# Patient Record
Sex: Female | Born: 1995 | Hispanic: No | Marital: Single | Smoking: Former smoker
Health system: Southern US, Community
[De-identification: ages and names within clinical notes are randomized; demographics above are authoritative.]

## PROBLEM LIST (undated history)

## (undated) DIAGNOSIS — J45909 Unspecified asthma, uncomplicated: Secondary | ICD-10-CM

## (undated) DIAGNOSIS — Z9109 Other allergy status, other than to drugs and biological substances: Secondary | ICD-10-CM

## (undated) DIAGNOSIS — K219 Gastro-esophageal reflux disease without esophagitis: Secondary | ICD-10-CM

## (undated) HISTORY — PX: NO PAST SURGERIES: SHX2092

## (undated) HISTORY — DX: Other allergy status, other than to drugs and biological substances: Z91.09

## (undated) HISTORY — DX: Unspecified asthma, uncomplicated: J45.909

---
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
## 2021-03-26 NOTE — L&D Delivery Note (Signed)
Delivery Note At 2:45 PM a viable female was delivered via Vaginal, Spontaneous (Presentation:   Occiput Anterior).  APGAR: 9, 9; weight  .   Placenta status: Spontaneous, Intact.  Cord: 3 vessels with the following complications: None.  Cord pH: n/a  Anesthesia: Epidural Episiotomy: None Lacerations: 2nd degree;Vaginal;Perineal Suture Repair:  0 vicryl Est. Blood Loss (mL):  153  Mom to postpartum.  Baby to Couplet care / Skin to Skin.  Purcell Nails 11/23/2021, 3:13 PM

## 2021-06-19 LAB — OB RESULTS CONSOLE RPR: RPR: NONREACTIVE

## 2021-06-19 LAB — OB RESULTS CONSOLE GC/CHLAMYDIA
Chlamydia: NEGATIVE
Neisseria Gonorrhea: NEGATIVE

## 2021-06-19 LAB — OB RESULTS CONSOLE HIV ANTIBODY (ROUTINE TESTING): HIV: NONREACTIVE

## 2021-06-19 LAB — HEPATITIS C ANTIBODY: HCV Ab: NEGATIVE

## 2021-06-19 LAB — OB RESULTS CONSOLE ABO/RH: RH Type: POSITIVE

## 2021-06-19 LAB — OB RESULTS CONSOLE HEPATITIS B SURFACE ANTIGEN: Hepatitis B Surface Ag: NEGATIVE

## 2021-06-19 LAB — OB RESULTS CONSOLE RUBELLA ANTIBODY, IGM: Rubella: NON-IMMUNE/NOT IMMUNE

## 2021-06-19 LAB — OB RESULTS CONSOLE ANTIBODY SCREEN: Antibody Screen: NEGATIVE

## 2021-11-02 LAB — OB RESULTS CONSOLE GBS: GBS: NEGATIVE

## 2021-11-22 ENCOUNTER — Inpatient Hospital Stay (HOSPITAL_COMMUNITY)
Admission: AD | Admit: 2021-11-22 | Discharge: 2021-11-25 | DRG: 806 | Disposition: A | Discharge status: Home / Self Care | Payer: BLUE CROSS/BLUE SHIELD | Attending: Obstetrics and Gynecology | Admitting: Obstetrics and Gynecology

## 2021-11-22 ENCOUNTER — Inpatient Hospital Stay (EMERGENCY_DEPARTMENT_HOSPITAL)
Admission: AD | Admit: 2021-11-22 | Discharge: 2021-11-22 | Disposition: A | Discharge status: Home / Self Care | Payer: BLUE CROSS/BLUE SHIELD | Source: Home / Self Care | Attending: Obstetrics & Gynecology | Admitting: Obstetrics & Gynecology

## 2021-11-22 ENCOUNTER — Other Ambulatory Visit: Payer: Self-pay

## 2021-11-22 ENCOUNTER — Encounter (HOSPITAL_COMMUNITY): Payer: Self-pay | Admitting: Obstetrics & Gynecology

## 2021-11-22 ENCOUNTER — Encounter (HOSPITAL_COMMUNITY): Payer: Self-pay | Admitting: *Deleted

## 2021-11-22 DIAGNOSIS — Z87891 Personal history of nicotine dependence: Secondary | ICD-10-CM

## 2021-11-22 DIAGNOSIS — J45909 Unspecified asthma, uncomplicated: Secondary | ICD-10-CM | POA: Diagnosis present

## 2021-11-22 DIAGNOSIS — O479 False labor, unspecified: Secondary | ICD-10-CM | POA: Diagnosis not present

## 2021-11-22 DIAGNOSIS — Z3A39 39 weeks gestation of pregnancy: Secondary | ICD-10-CM | POA: Insufficient documentation

## 2021-11-22 DIAGNOSIS — D62 Acute posthemorrhagic anemia: Secondary | ICD-10-CM | POA: Diagnosis not present

## 2021-11-22 DIAGNOSIS — O471 False labor at or after 37 completed weeks of gestation: Secondary | ICD-10-CM | POA: Insufficient documentation

## 2021-11-22 DIAGNOSIS — O9081 Anemia of the puerperium: Secondary | ICD-10-CM | POA: Diagnosis not present

## 2021-11-22 DIAGNOSIS — Z23 Encounter for immunization: Secondary | ICD-10-CM | POA: Diagnosis not present

## 2021-11-22 DIAGNOSIS — Z3689 Encounter for other specified antenatal screening: Secondary | ICD-10-CM

## 2021-11-22 DIAGNOSIS — O9952 Diseases of the respiratory system complicating childbirth: Secondary | ICD-10-CM | POA: Diagnosis present

## 2021-11-22 DIAGNOSIS — O4202 Full-term premature rupture of membranes, onset of labor within 24 hours of rupture: Secondary | ICD-10-CM

## 2021-11-22 DIAGNOSIS — O26893 Other specified pregnancy related conditions, third trimester: Secondary | ICD-10-CM | POA: Diagnosis present

## 2021-11-22 HISTORY — DX: Gastro-esophageal reflux disease without esophagitis: K21.9

## 2021-11-22 LAB — URINALYSIS, ROUTINE W REFLEX MICROSCOPIC
Bilirubin Urine: NEGATIVE
Glucose, UA: NEGATIVE mg/dL
Ketones, ur: NEGATIVE mg/dL
Nitrite: NEGATIVE
Protein, ur: NEGATIVE mg/dL
Specific Gravity, Urine: 1.004 — ABNORMAL LOW (ref 1.005–1.030)
pH: 7 (ref 5.0–8.0)

## 2021-11-22 LAB — CBC
HCT: 36.7 % (ref 36.0–46.0)
Hemoglobin: 11.9 g/dL — ABNORMAL LOW (ref 12.0–15.0)
MCH: 27.9 pg (ref 26.0–34.0)
MCHC: 32.4 g/dL (ref 30.0–36.0)
MCV: 85.9 fL (ref 80.0–100.0)
Platelets: 206 10*3/uL (ref 150–400)
RBC: 4.27 MIL/uL (ref 3.87–5.11)
RDW: 14.4 % (ref 11.5–15.5)
WBC: 12.9 10*3/uL — ABNORMAL HIGH (ref 4.0–10.5)
nRBC: 0 % (ref 0.0–0.2)

## 2021-11-22 LAB — TYPE AND SCREEN
ABO/RH(D): A POS
Antibody Screen: NEGATIVE

## 2021-11-22 MED ORDER — ONDANSETRON HCL 4 MG/2ML IJ SOLN: 4.0000 mg | Freq: Four times a day (QID) | INTRAMUSCULAR | Status: DC | PRN

## 2021-11-22 MED ORDER — OXYTOCIN-SODIUM CHLORIDE 30-0.9 UT/500ML-% IV SOLN
1.0000 m[IU]/min | INTRAVENOUS | Status: DC
  Administered 2021-11-22 – 2021-11-23 (×2): 2 m[IU]/min via INTRAVENOUS
  Filled 2021-11-22: qty 500

## 2021-11-22 MED ORDER — FLEET ENEMA 7-19 GM/118ML RE ENEM: 1.0000 | ENEMA | RECTAL | Status: DC | PRN

## 2021-11-22 MED ORDER — ACETAMINOPHEN 325 MG PO TABS: 650.0000 mg | ORAL_TABLET | ORAL | Status: DC | PRN

## 2021-11-22 MED ORDER — OXYTOCIN-SODIUM CHLORIDE 30-0.9 UT/500ML-% IV SOLN: 2.5000 [IU]/h | INTRAVENOUS | Status: DC

## 2021-11-22 MED ORDER — LIDOCAINE HCL (PF) 1 % IJ SOLN
30.0000 mL | INTRAMUSCULAR | Status: DC | PRN
Start: 2021-11-22 — End: 2021-11-23

## 2021-11-22 MED ORDER — OXYCODONE-ACETAMINOPHEN 5-325 MG PO TABS: 1.0000 | ORAL_TABLET | ORAL | Status: DC | PRN

## 2021-11-22 MED ORDER — OXYTOCIN BOLUS FROM INFUSION
333.0000 mL | Freq: Once | INTRAVENOUS | Status: AC
  Administered 2021-11-23: 333 mL via INTRAVENOUS

## 2021-11-22 MED ORDER — LACTATED RINGERS IV SOLN: INTRAVENOUS | Status: DC

## 2021-11-22 MED ORDER — OXYCODONE-ACETAMINOPHEN 5-325 MG PO TABS: 2.0000 | ORAL_TABLET | ORAL | Status: DC | PRN

## 2021-11-22 MED ORDER — TERBUTALINE SULFATE 1 MG/ML IJ SOLN
0.2500 mg | Freq: Once | INTRAMUSCULAR | Status: AC | PRN
  Administered 2021-11-23: 0.25 mg via SUBCUTANEOUS
  Filled 2021-11-22: qty 1

## 2021-11-22 MED ORDER — LACTATED RINGERS IV SOLN
500.0000 mL | INTRAVENOUS | Status: DC | PRN
  Administered 2021-11-23 (×3): 500 mL via INTRAVENOUS

## 2021-11-22 MED ORDER — SOD CITRATE-CITRIC ACID 500-334 MG/5ML PO SOLN: 30.0000 mL | ORAL | Status: DC | PRN

## 2021-11-22 MED ORDER — FENTANYL CITRATE (PF) 100 MCG/2ML IJ SOLN
100.0000 ug | INTRAMUSCULAR | Status: DC | PRN
  Administered 2021-11-22 – 2021-11-23 (×2): 100 ug via INTRAVENOUS
  Filled 2021-11-22 (×2): qty 2

## 2021-11-22 NOTE — MAU Provider Note (Signed)
History   [id number]   Chief Complaint  Patient presents with   Labor Eval    HPI [place] C Mccaughan is a 26 y.o. female  G1P0 @39 .3 wks here with report of leaking pink tinged clear fluid since 1400 today.  Leaking of fluid has continued. Pt reports regular painful contractions. She denies vaginal bleeding. She reports good fetal movement. All other systems negative.    No LMP recorded. Patient is pregnant.  OB History  Gravida Para Term Preterm AB Living  1            SAB IAB Ectopic Multiple Live Births               # Outcome Date GA Lbr Len/2nd Weight Sex Delivery Anes PTL Lv  1 Current             Past Medical History:  Diagnosis Date   Asthma, chronic 09/08/2013   Environmental allergies 09/08/2013   GERD (gastroesophageal reflux disease)     No family history on file.  Social History   Socioeconomic History   Marital status: Single    Spouse name: Not on file   Number of children: Not on file   Years of education: Not on file   Highest education level: Not on file  Occupational History   Not on file  Tobacco Use   Smoking status: Former    Types: Cigarettes   Smokeless tobacco: Never  Substance and Sexual Activity   Alcohol use: Not Currently   Drug use: Never   Sexual activity: Not on file  Other Topics Concern   Not on file  Social History Narrative   Not on file   Social Determinants of Health   Financial Resource Strain: Not on file  Food Insecurity: Not on file  Transportation Needs: Not on file  Physical Activity: Not on file  Stress: Not on file  Social Connections: Not on file    No Known Allergies  No current facility-administered medications on file prior to encounter.   Current Outpatient Medications on File Prior to Encounter  Medication Sig Dispense Refill   Pantoprazole Sodium (PROTONIX PO) Take 20 mg by mouth daily.     Prenatal Vit-Fe Fumarate-FA (MULTIVITAMIN-PRENATAL) 27-0.8 MG TABS tablet Take 1 tablet by mouth daily  at 12 noon.       Review of Systems  Gastrointestinal:  Positive for abdominal pain.  Genitourinary:  Positive for vaginal discharge.     Physical Exam   Vitals:   11/22/21 1814  BP: 133/73  Pulse: 87  Resp: 18    Physical Exam Vitals and nursing note reviewed.  Constitutional:      General: She is not in acute distress.    Appearance: Normal appearance.  HENT:     Head: Normocephalic and atraumatic.  Cardiovascular:     Rate and Rhythm: Normal rate.  Pulmonary:     Effort: Pulmonary effort is normal. No respiratory distress.  Genitourinary:    Comments: SSE: +pool, fern pos SVE: 3/90/-1, vtx Musculoskeletal:        General: Normal range of motion.     Cervical back: Normal range of motion.  Skin:    General: Skin is warm and dry.  Neurological:     General: No focal deficit present.     Mental Status: She is alert and oriented to person, place, and time.  Psychiatric:        Mood and Affect: Mood normal.  Behavior: Behavior normal.   EFM: 145 bpm, mod variability, + accels, variable decels Toco: 3-4  Results for orders placed or performed during the hospital encounter of 11/22/21 (from the past 24 hour(s))  Urinalysis, Routine w reflex microscopic Urine, Clean Catch     Status: Abnormal   Collection Time: 11/22/21 12:48 PM  Result Value Ref Range   Color, Urine YELLOW YELLOW   APPearance HAZY (A) CLEAR   Specific Gravity, Urine 1.004 (L) 1.005 - 1.030   pH 7.0 5.0 - 8.0   Glucose, UA NEGATIVE NEGATIVE mg/dL   Hgb urine dipstick SMALL (A) NEGATIVE   Bilirubin Urine NEGATIVE NEGATIVE   Ketones, ur NEGATIVE NEGATIVE mg/dL   Protein, ur NEGATIVE NEGATIVE mg/dL   Nitrite NEGATIVE NEGATIVE   Leukocytes,Ua TRACE (A) NEGATIVE   RBC / HPF 0-5 0 - 5 RBC/hpf   WBC, UA 0-5 0 - 5 WBC/hpf   Bacteria, UA RARE (A) NONE SEEN   Squamous Epithelial / LPF 0-5 0 - 5    MAU Course  Procedures  MDM SROM confirmed. Plan for admit.   Assessment and Plan   1.  [redacted] weeks gestation of pregnancy   2. Indication for care in labor or delivery    Admit to LD Mngt per primary OB   Donette Larry, [place] 11/22/2021 7:10 PM

## 2021-11-22 NOTE — MAU Note (Signed)
Brianna Wallace is a 26 y.o. at [redacted]w[redacted]d here in MAU reporting: ctxs that began this morning @ approx 0700, states ctxs are 3-4 minutes apart.  Denies VB or LOF, reports some bloody show.  Endorses +FM. LMP: N/A Onset of complaint: today Pain score: 7 Vitals:   11/22/21 1245  BP: 135/84  Pulse: 85  Resp: 20  Temp: 98.5 F (36.9 C)  SpO2: 97%     FHT:153 bpm Lab orders placed from triage:   UA
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
## 2021-11-22 NOTE — MAU Note (Signed)
Pt here earlier for labor check. Reports ctx stronger and closer. Has leaked some pink fluid. Good fetal movement reported.

## 2021-11-22 NOTE — MAU Provider Note (Signed)
Per RN: Ms. [place] C Saran is a 26 y.o. G1P0 at [redacted]w[redacted]d  who presents to MAU today complaining contractions q 3-4 minutes since 0700. No VB or LOF. +FM.   O: BP 135/84 (BP Location: Right Arm)   Pulse 85   Temp 98.5 F (36.9 C) (Oral)   Resp 20   Ht 5\' 3"  (1.6 m)   Wt 87.6 kg   SpO2 97%   BMI 34.22 kg/m   Cervical exam:  Dilation: Fingertip Effacement (%): 50 Station: -2 Exam by:: K.Wilson,RN  Fetal Monitoring: Baseline: 145 Variability: mod Accelerations: + Decelerations: no Contractions: irreg   A: SIUP at [redacted]w[redacted]d  False labor Reactive NST  P: Discharge home Follow up @[facility]  as scheduled Labor precautions  [redacted]w[redacted]d, CNM 11/22/2021 1:46 PM

## 2021-11-23 ENCOUNTER — Encounter (HOSPITAL_COMMUNITY): Payer: Self-pay | Admitting: Obstetrics & Gynecology

## 2021-11-23 ENCOUNTER — Inpatient Hospital Stay (HOSPITAL_COMMUNITY): Payer: BLUE CROSS/BLUE SHIELD | Admitting: Anesthesiology

## 2021-11-23 LAB — RPR: RPR Ser Ql: NONREACTIVE

## 2021-11-23 MED ORDER — OXYTOCIN-SODIUM CHLORIDE 30-0.9 UT/500ML-% IV SOLN: 1.0000 m[IU]/min | INTRAVENOUS | Status: DC

## 2021-11-23 MED ORDER — OXYCODONE HCL 5 MG PO TABS: 10.0000 mg | ORAL_TABLET | ORAL | Status: DC | PRN

## 2021-11-23 MED ORDER — LIDOCAINE HCL (PF) 1 % IJ SOLN
INTRAMUSCULAR | Status: DC | PRN
  Administered 2021-11-23: 8 mL via EPIDURAL

## 2021-11-23 MED ORDER — TETANUS-DIPHTH-ACELL PERTUSSIS 5-2.5-18.5 LF-MCG/0.5 IM SUSY: 0.5000 mL | PREFILLED_SYRINGE | Freq: Once | INTRAMUSCULAR | Status: DC

## 2021-11-23 MED ORDER — EPHEDRINE 5 MG/ML INJ
10.0000 mg | INTRAVENOUS | Status: DC | PRN
Start: 2021-11-23 — End: 2021-11-23

## 2021-11-23 MED ORDER — TRANEXAMIC ACID-NACL 1000-0.7 MG/100ML-% IV SOLN
INTRAVENOUS | Status: AC
  Filled 2021-11-23: qty 100

## 2021-11-23 MED ORDER — DIPHENHYDRAMINE HCL 50 MG/ML IJ SOLN: 12.5000 mg | INTRAMUSCULAR | Status: DC | PRN

## 2021-11-23 MED ORDER — COCONUT OIL OIL: 1.0000 | TOPICAL_OIL | Status: DC | PRN

## 2021-11-23 MED ORDER — PRENATAL MULTIVITAMIN CH
1.0000 | ORAL_TABLET | Freq: Every day | ORAL | Status: DC
  Administered 2021-11-24 – 2021-11-25 (×2): 1 via ORAL
  Filled 2021-11-23 (×2): qty 1

## 2021-11-23 MED ORDER — PANTOPRAZOLE SODIUM 20 MG PO TBEC: 20.0000 mg | DELAYED_RELEASE_TABLET | Freq: Every day | ORAL | Status: DC | PRN

## 2021-11-23 MED ORDER — ZOLPIDEM TARTRATE 5 MG PO TABS: 5.0000 mg | ORAL_TABLET | Freq: Every evening | ORAL | Status: DC | PRN

## 2021-11-23 MED ORDER — DIPHENHYDRAMINE HCL 25 MG PO CAPS: 25.0000 mg | ORAL_CAPSULE | Freq: Four times a day (QID) | ORAL | Status: DC | PRN

## 2021-11-23 MED ORDER — FENTANYL-BUPIVACAINE-NACL 0.5-0.125-0.9 MG/250ML-% EP SOLN
12.0000 mL/h | EPIDURAL | Status: DC | PRN
  Administered 2021-11-23: 12 mL/h via EPIDURAL
  Filled 2021-11-23: qty 250

## 2021-11-23 MED ORDER — BENZOCAINE-MENTHOL 20-0.5 % EX AERO
1.0000 | INHALATION_SPRAY | CUTANEOUS | Status: DC | PRN
  Filled 2021-11-23: qty 56

## 2021-11-23 MED ORDER — OXYCODONE HCL 5 MG PO TABS: 5.0000 mg | ORAL_TABLET | ORAL | Status: DC | PRN

## 2021-11-23 MED ORDER — SIMETHICONE 80 MG PO CHEW: 80.0000 mg | CHEWABLE_TABLET | ORAL | Status: DC | PRN

## 2021-11-23 MED ORDER — TRANEXAMIC ACID-NACL 1000-0.7 MG/100ML-% IV SOLN
1000.0000 mg | INTRAVENOUS | Status: AC
Start: 2021-11-23 — End: 2021-11-23
  Administered 2021-11-23: 1000 mg via INTRAVENOUS

## 2021-11-23 MED ORDER — PHENYLEPHRINE 80 MCG/ML (10ML) SYRINGE FOR IV PUSH (FOR BLOOD PRESSURE SUPPORT)
80.0000 ug | PREFILLED_SYRINGE | INTRAVENOUS | Status: DC | PRN
  Filled 2021-11-23: qty 10

## 2021-11-23 MED ORDER — PHENYLEPHRINE 80 MCG/ML (10ML) SYRINGE FOR IV PUSH (FOR BLOOD PRESSURE SUPPORT): 80.0000 ug | PREFILLED_SYRINGE | INTRAVENOUS | Status: DC | PRN

## 2021-11-23 MED ORDER — ONDANSETRON HCL 4 MG/2ML IJ SOLN: 4.0000 mg | INTRAMUSCULAR | Status: DC | PRN

## 2021-11-23 MED ORDER — IBUPROFEN 600 MG PO TABS
600.0000 mg | ORAL_TABLET | Freq: Four times a day (QID) | ORAL | Status: DC
  Administered 2021-11-23 – 2021-11-25 (×9): 600 mg via ORAL
  Filled 2021-11-23 (×9): qty 1

## 2021-11-23 MED ORDER — ONDANSETRON HCL 4 MG PO TABS: 4.0000 mg | ORAL_TABLET | ORAL | Status: DC | PRN

## 2021-11-23 MED ORDER — ACETAMINOPHEN 325 MG PO TABS: 650.0000 mg | ORAL_TABLET | ORAL | Status: DC | PRN

## 2021-11-23 MED ORDER — LACTATED RINGERS IV SOLN
500.0000 mL | Freq: Once | INTRAVENOUS | Status: AC
  Administered 2021-11-23: 500 mL via INTRAVENOUS

## 2021-11-23 MED ORDER — DIBUCAINE (PERIANAL) 1 % EX OINT: 1.0000 | TOPICAL_OINTMENT | CUTANEOUS | Status: DC | PRN

## 2021-11-23 MED ORDER — WITCH HAZEL-GLYCERIN EX PADS: 1.0000 | MEDICATED_PAD | CUTANEOUS | Status: DC | PRN

## 2021-11-23 MED ORDER — EPHEDRINE 5 MG/ML INJ: 10.0000 mg | INTRAVENOUS | Status: DC | PRN

## 2021-11-23 MED ORDER — OXYTOCIN-SODIUM CHLORIDE 30-0.9 UT/500ML-% IV SOLN: 2.5000 [IU]/h | INTRAVENOUS | Status: DC | PRN

## 2021-11-23 MED ORDER — SENNOSIDES-DOCUSATE SODIUM 8.6-50 MG PO TABS
2.0000 | ORAL_TABLET | Freq: Every day | ORAL | Status: DC
  Administered 2021-11-24: 2 via ORAL
  Filled 2021-11-23: qty 2

## 2021-11-23 NOTE — Lactation Note (Signed)
This note was copied from a baby's chart. Lactation Consultation Note  Patient Name: Brianna Wallace [id number] Date: 11/23/2021 Reason for consult: L&D Initial assessment;Primapara;Term;1st time breastfeeding Age:25 hours   Initial L&D Consult:  Arrived to assist with first latch, however, RN in room assessing and weighing baby.  Informed me that she was able to assist with latching and baby fed well.  Thanked Charity fundraiser for her assistance.  Will follow up on the M/B unit.  Allowed time for family bonding.   Maternal Data    Feeding Mother's Current Feeding Choice: Breast Milk  LATCH Score Latch: Grasps breast easily, tongue down, lips flanged, rhythmical sucking.  Audible Swallowing: A few with stimulation  Type of Nipple: Everted at rest and after stimulation  Comfort (Breast/Nipple): Soft / non-tender  Hold (Positioning): Full assist, staff holds infant at breast  LATCH Score: 7   Lactation Tools Discussed/Used    Interventions    Discharge    Consult Status Consult Status: Follow-up from L&D    Quinesha Selinger R Sela Falk 11/23/2021, 4:03 PM

## 2021-11-23 NOTE — Anesthesia Preprocedure Evaluation (Signed)
Anesthesia Evaluation  Patient identified by MRN, date of birth, ID band Patient awake    Reviewed: Allergy & Precautions, H&P , Patient's Chart, lab work & pertinent test results  Airway Mallampati: I       Dental no notable dental hx.    Pulmonary former smoker,    Pulmonary exam normal        Cardiovascular negative cardio ROS Normal cardiovascular exam     Neuro/Psych Anxiety negative neurological ROS     GI/Hepatic Neg liver ROS,   Endo/Other  negative endocrine ROS  Renal/GU negative Renal ROS     Musculoskeletal negative musculoskeletal ROS (+)   Abdominal (+) + obese,   Peds  Hematology negative hematology ROS (+)   Anesthesia Other Findings   Reproductive/Obstetrics (+) Pregnancy                             Anesthesia Physical Anesthesia Plan  ASA: 2  Anesthesia Plan: Epidural   Post-op Pain Management:    Induction:   PONV Risk Score and Plan:   Airway Management Planned:   Additional Equipment:   Intra-op Plan:   Post-operative Plan:   Informed Consent: I have reviewed the patients History and Physical, chart, labs and discussed the procedure including the risks, benefits and alternatives for the proposed anesthesia with the patient or authorized representative who has indicated his/her understanding and acceptance.       Plan Discussed with:   Anesthesia Plan Comments:         Anesthesia Quick Evaluation

## 2021-11-23 NOTE — Lactation Note (Signed)
This note was copied from a baby's chart. Lactation Consultation Note  Patient Name: Brianna Wallace [id number] Date: 11/23/2021 Reason for consult: Initial assessment;Mother's request;Primapara;1st time breastfeeding;Term;Breastfeeding assistance Age:25 hours  Birth parent stated feeding for over an hour. Compression stripe noted. LC assisted to latch deeper with signs of milk transfer.   Plan 1. To feed based on cues 8-12x 24hr period. To latch at the breast note signs of milk transfer.  2. If unable to latch or still cueing offer colostrum on spoon 5-7 ml per feeding.  All questions answered at the end of the visit.  Nipples compressed but skin intact. Birth parent denied any pain with the latch.  All questions answered at the end of the visit.   Maternal Data Has patient been taught Hand Expression?: Yes Does the patient have breastfeeding experience prior to this delivery?: No  Feeding Mother's Current Feeding Choice: Breast Milk  LATCH Score Latch: Repeated attempts needed to sustain latch, nipple held in mouth throughout feeding, stimulation needed to elicit sucking reflex.  Audible Swallowing: A few with stimulation  Type of Nipple: Everted at rest and after stimulation  Comfort (Breast/Nipple): Soft / non-tender  Hold (Positioning): Assistance needed to correctly position infant at breast and maintain latch.  LATCH Score: 7   Lactation Tools Discussed/Used    Interventions Interventions: Breast feeding basics reviewed;Assisted with latch;Skin to skin;Breast massage;Hand express;Breast compression;Adjust position;Support pillows;Position options;Expressed milk;Education;LC Psychologist, educational;Visual merchandiser education  Discharge WIC Program: Yes  Consult Status Consult Status: Follow-up Date: 11/24/21 Follow-up type: In-patient    Angi Goodell  [place] 11/23/2021, 10:12 PM

## 2021-11-23 NOTE — Anesthesia Procedure Notes (Signed)
Epidural Patient location during procedure: OB Start time: 11/23/2021 1:35 AM End time: 11/23/2021 1:40 AM  Staffing Anesthesiologist: Leilani Able, MD Performed: anesthesiologist   Preanesthetic Checklist Completed: patient identified, IV checked, site marked, risks and benefits discussed, surgical consent, monitors and equipment checked, pre-op evaluation and timeout performed  Epidural Patient position: sitting Prep: DuraPrep and site prepped and draped Patient monitoring: continuous pulse ox and blood pressure Approach: midline Location: L3-L4 Injection technique: LOR air  Needle:  Needle type: Tuohy  Needle gauge: 17 G Needle length: 9 cm and 9 Needle insertion depth: 7 cm Catheter type: closed end flexible Catheter size: 19 Gauge Catheter at skin depth: 12 cm Test dose: negative and Other  Assessment Events: blood not aspirated, injection not painful, no injection resistance, no paresthesia and negative IV test  Additional Notes Reason for block:procedure for pain

## 2021-11-24 LAB — CBC
HCT: 30.9 % — ABNORMAL LOW (ref 36.0–46.0)
Hemoglobin: 10 g/dL — ABNORMAL LOW (ref 12.0–15.0)
MCH: 27.7 pg (ref 26.0–34.0)
MCHC: 32.4 g/dL (ref 30.0–36.0)
MCV: 85.6 fL (ref 80.0–100.0)
Platelets: 185 10*3/uL (ref 150–400)
RBC: 3.61 MIL/uL — ABNORMAL LOW (ref 3.87–5.11)
RDW: 14.7 % (ref 11.5–15.5)
WBC: 15.8 10*3/uL — ABNORMAL HIGH (ref 4.0–10.5)
nRBC: 0 % (ref 0.0–0.2)

## 2021-11-24 MED ORDER — MEASLES, MUMPS & RUBELLA VAC IJ SOLR
0.5000 mL | Freq: Once | INTRAMUSCULAR | Status: AC
  Administered 2021-11-25: 0.5 mL via SUBCUTANEOUS
  Filled 2021-11-24: qty 0.5

## 2021-11-24 NOTE — Social Work (Addendum)
MOB was referred for history of depression.  * Referral screened out by Clinical Social Worker because none of the following criteria appear to apply:  ~ History of anxiety/depression during this pregnancy, or of post-partum depression following prior delivery.  ~ Diagnosis of anxiety and/or depression within last 3 years. Per chart review, MOB had depression as a teenager, no recent issues. No other mental health concerns noted in the OB record. MOB completed Edinburgh score 6.   OR  * MOB's symptoms currently being treated with medication and/or therapy.  Please contact the Clinical Social Worker if needs arise, by [facility] request, or if MOB scores greater than 9/yes to question 10 on Edinburgh Postpartum Depression Screen.   Vivi Barrack, MSW, LCSW [facility] [place]  Clinical Social Worker  [phone] 11/24/2021  9:15 AM

## 2021-11-24 NOTE — Lactation Note (Signed)
This note was copied from a baby's chart. Lactation Consultation Note  Patient Name: Brianna Wallace [id number] Date: 11/24/2021 Reason for consult: Follow-up assessment;1st time breastfeeding;Term Age:25 hours LC changed void and greenish stool while in room, Infant had 4 voids and 2 stools since birth. Birth Parent was concern that she doesn't have enough milk, recently infant is wanting BF more frequently, LC explained infant is past 24 hrs and  infant is starting to cluster feed this is normal pattern of infant behavior. LC did not observe latch infant recently BF for 19 minutes, Birth Parent nipples are intact no abrasions or sores.  LC advised Birth Parent to continue to BF on demand, by cues, 8 to 12+ times within 24 hours, STS. LC reviewed hand expression, Birth Parent expressed 3 mls that was spoon fed to infant, infant appeared content afterwards and infant fell asleep after diaper change and swaddle. Birth Parent is thinking about supplementing infant with donor breast milk but choose not to at this time. Birth Parent requested to use DEBP, LC set up pump and explained how to use, Birth Parent plans to pump 6 times within 24 hrs, pump 15 minutes on initial setting. Birth Parent knows to call RN/LC if their are BF questions, concerns or need latch assistance.  Maternal Data    Feeding Mother's Current Feeding Choice: Breast Milk  LATCH Score  Lactation Tools Discussed/Used Tools: Pump Breast pump type: Double-Electric Breast Pump Pump Education: Setup, frequency, and cleaning;Milk Storage Reason for Pumping: Birth Parent request Pumping frequency: Birth Parent will pump 6x within 24 hrs at her discretion.  Interventions Interventions: Skin to skin;Hand express;Expressed milk;Education;DEBP  Discharge    Consult Status Consult Status: Follow-up Date: 11/25/21 Follow-up type: In-patient    Brianna Wallace 11/24/2021, 5:10 PM

## 2021-11-24 NOTE — Anesthesia Postprocedure Evaluation (Signed)
Anesthesia Post Note  Patient: [place] C Lawrance  Procedure(s) Performed: AN AD HOC LABOR EPIDURAL     Patient location during evaluation: Mother Baby Anesthesia Type: Epidural Level of consciousness: awake and alert Pain management: pain level controlled Vital Signs Assessment: post-procedure vital signs reviewed and stable Respiratory status: spontaneous breathing, nonlabored ventilation and respiratory function stable Cardiovascular status: stable Postop Assessment: no headache, no backache, epidural receding, no apparent nausea or vomiting, patient able to bend at knees, adequate PO intake and able to ambulate Anesthetic complications: no   No notable events documented.  Last Vitals:  Vitals:   11/23/21 2220 11/24/21 0220  BP: 124/68 122/74  Pulse: 64 71  Resp: 18 18  Temp: 37 C 36.7 C  SpO2: 100% 100%    Last Pain:  Vitals:   11/24/21 0620  TempSrc:   PainSc: 0-No pain   Pain Goal:                   Laban Emperor

## 2021-11-24 NOTE — Progress Notes (Signed)
PPD# 1 SVD w/ 2nd degree;Vaginal;Perineal Information for the patient's newborn:  Brianna Wallace, Girl [place] [[id number]]  female    S:   Reports feeling tired and cramping Tolerating PO fluid and solids No nausea or vomiting Bleeding is light, no clots Pain controlled with acetaminophen and ibuprofen (OTC) Up ad lib / ambulatory / voiding w/o difficulty Feeding: Breast    O:   VS: BP 122/74 (BP Location: Left Arm)   Pulse 71   Temp 98 F (36.7 C) (Oral)   Resp 18   SpO2 100%   Breastfeeding Unknown   LABS:  Recent Labs    11/22/21 1912 11/24/21 0528  WBC 12.9* 15.8*  HGB 11.9* 10.0*  PLT 206 185   Blood type: --/--/A POS (08/30 1920) Rubella: Nonimmune (03/27 0000)                      I&O: Intake/Output      08/31 0701 09/01 0700 09/01 0701 09/02 0700   Urine 1100    Blood 153    Total Output 1253    Net -1253         Urine Occurrence 2 x      Physical Exam: Alert and oriented X3 Lungs: Clear and unlabored Heart: regular rate and rhythm / no mumurs Abdomen: soft, non-tender, non-distended  Fundus: firm, non-tender Perineum: approximated, healing Lochia: appropriate Extremities: no edema, no calf pain, tenderness, or cords    A:  PPD # 1  Normal exam  P:  Routine post partum orders Lactation assistance Anticipate D/C on 11/25/21  Plan reviewed w/ Dr. Camillo Flaming, DNP, CNM 11/24/2021, 8:30 AM

## 2021-11-25 DIAGNOSIS — D62 Acute posthemorrhagic anemia: Secondary | ICD-10-CM | POA: Diagnosis not present

## 2021-11-25 MED ORDER — ACETAMINOPHEN 325 MG PO TABS: 650.0000 mg | ORAL_TABLET | Freq: Four times a day (QID) | ORAL | Status: AC

## 2021-11-25 MED ORDER — IBUPROFEN 600 MG PO TABS: 600.0000 mg | ORAL_TABLET | Freq: Four times a day (QID) | ORAL | 0 refills | Status: AC

## 2021-11-25 NOTE — Discharge Summary (Cosign Needed Addendum)
Postpartum Discharge Summary  Date of Service updated 11/25/21     Patient Name: Brianna Wallace DOB: October 19, 1995 MRN: [id number]  Date of admission: 11/22/2021 Delivery date:11/23/2021  Delivering provider: Everett Graff  Date of discharge: 11/25/2021  Admitting diagnosis: Normal labor [O80, Z37.9] Intrauterine pregnancy: [redacted]w[redacted]d     Secondary diagnosis:  Principal Problem:   Normal labor Active Problems:   SVD (spontaneous vaginal delivery)   Acute blood loss anemia  Additional problems: none    Discharge diagnosis: Term Pregnancy Delivered and Anemia                                              Post partum procedures: none Augmentation: Pitocin Complications: None  Hospital course: Onset of Labor With Vaginal Delivery      26 y.o. yo G1P1001 at [redacted]w[redacted]d was admitted in Latent Labor with spontaneous rupture of membranes on 11/22/2021. Patient had an uncomplicated labor course as follows:  Membrane Rupture Time/Date: 2:00 PM ,11/22/2021   Delivery Method:Vaginal, Spontaneous  Episiotomy: None  Lacerations:  2nd degree;Vaginal;Perineal  Patient had an uncomplicated postpartum course.  She is ambulating, tolerating a regular diet, passing flatus, and urinating well. Denies need for medication for hx of anxiety. Reports adequate support from S/O and family. Agrees to mood eval at 2 weeks. Patient is discharged home in stable condition on 11/25/21.  Newborn Data: Birth date:11/23/2021  Birth time:2:45 PM  Gender:Female  Living status:Living  Apgars:9 ,9  Weight:2960 g   Magnesium Sulfate received: No BMZ received: No Rhophylac:N/A MMR: to be administered prior to discharge Transfusion:No  Physical exam  Vitals:   11/24/21 0220 11/24/21 1323 11/24/21 2015 11/25/21 0543  BP: 122/74 117/72 120/68 117/71  Pulse: 71 88 70 67  Resp: $Remo'18 18 18 18  'MuAOP$ Temp: 98 F (36.7 C) 98.4 F (36.9 C) 98 F (36.7 C) 98.4 F (36.9 C)  TempSrc: Oral Oral Oral Oral  SpO2: 100%   98%    General: alert, cooperative, and no distress Lochia: appropriate Uterine Fundus: firm Incision: N/A DVT Evaluation: No evidence of DVT seen on physical exam. No cords or calf tenderness. No significant calf/ankle edema. Labs: Lab Results  Component Value Date   WBC 15.8 (H) 11/24/2021   HGB 10.0 (L) 11/24/2021   HCT 30.9 (L) 11/24/2021   MCV 85.6 11/24/2021   PLT 185 11/24/2021       No data to display         Edinburgh Score:    11/23/2021    6:21 PM  Edinburgh Postnatal Depression Scale Screening Tool  I have been able to laugh and see the funny side of things. 0  I have looked forward with enjoyment to things. 0  I have blamed myself unnecessarily when things went wrong. 1  I have been anxious or worried for no good reason. 2  I have felt scared or panicky for no good reason. 1  Things have been getting on top of me. 0  I have been so unhappy that I have had difficulty sleeping. 0  I have felt sad or miserable. 1  I have been so unhappy that I have been crying. 1  The thought of harming myself has occurred to me. 0  Edinburgh Postnatal Depression Scale Total 6      After visit meds:  Allergies as of 11/25/2021  No Known Allergies      Medication List     STOP taking these medications    PROTONIX PO       TAKE these medications    acetaminophen 325 MG tablet Commonly known as: Tylenol Take 2 tablets (650 mg total) by mouth every 6 (six) hours.   ibuprofen 600 MG tablet Commonly known as: ADVIL Take 1 tablet (600 mg total) by mouth every 6 (six) hours.   multivitamin-prenatal 27-0.8 MG Tabs tablet Take 1 tablet by mouth daily at 12 noon.         Discharge home in stable condition Infant Feeding: Breast Infant Disposition:home with mother Discharge instruction: per After Visit Summary and Postpartum booklet. Activity: Advance as tolerated. Pelvic rest for 6 weeks.  Diet: routine diet Anticipated Birth Control: Unsure and considering  COC or the ring Postpartum Appointment:6 weeks Additional Postpartum F/U: Postpartum Depression checkup Future Appointments:No future appointments. Follow up Visit:  [facility] Follow up in 6 week(s).   Specialty: Obstetrics and Gynecology Contact information: [address] [place] [address] [phone]                    11/25/2021 Arrie Eastern, CNM

## 2021-11-25 NOTE — Lactation Note (Signed)
This note was copied from a baby's chart. Lactation Consultation Note  Patient Name: Brianna Wallace Date: 11/25/2021 Reason for consult: Follow-up assessment;Primapara;1st time breastfeeding;Term;Infant weight loss (7 % weight loss, , elevated Bili , repeat early evening. LC offered to assist to latch, LC reviewed basics and steps for latching, and checking flanges. Bbaby latched and fed 15 mins , with swallows.) Latch score 9  Age:25 hours LC reviewed BF D/C teaching and provided a LC plan. Milk is coming in and breast are warm, increased swallows.  D/C is pending the bilirubin.  Maternal Data Has patient been taught Hand Expression?: Yes  Feeding Mother's Current Feeding Choice: Breast Milk  LATCH Score Latch: Grasps breast easily, tongue down, lips flanged, rhythmical sucking.  Audible Swallowing: Spontaneous and intermittent  Type of Nipple: Everted at rest and after stimulation  Comfort (Breast/Nipple): Soft / non-tender  Hold (Positioning): Assistance needed to correctly position infant at breast and maintain latch.  LATCH Score: 9   Lactation Tools Discussed/Used Tools: Pump;Flanges Flange Size: 24;21 Breast pump type: Manual;Double-Electric Breast Pump Pump Education: Milk Storage  Interventions Interventions: Breast feeding basics reviewed;Assisted with latch;Skin to skin;Breast massage;Hand express;Pre-pump if needed;Reverse pressure;Breast compression;Adjust position;Support pillows;Position options;Hand pump;Education;LC Services brochure  Discharge Discharge Education: Engorgement and breast care;Warning signs for feeding baby Pump: DEBP;Manual;Personal  Consult Status Consult Status: Complete Date: 11/25/21    Kathrin Greathouse 11/25/2021, 3:49 PM

## 2021-11-26 ENCOUNTER — Ambulatory Visit: Payer: Self-pay

## 2021-11-26 NOTE — Lactation Note (Signed)
This note was copied from a baby's chart. Lactation Consultation Note  Patient Name: Brianna Wallace IPJAS'N Date: 11/26/2021 Reason for consult: Follow-up assessment;Exclusive pumping and bottle feeding Age:25 hours  Baby sleeping under phototherapy lights.  Mother recently pumped 60 ml. Discussed milk storage.  Baby has not stooled in 24 hours.  Provided education that it is good that birth parent is now giving baby larger volume she pumped of breastmilk and will hopefully help with stool. Mother's breasts are filling.  Reviewed engorgement care.   Feeding Mother's Current Feeding Choice: Breast Milk and Formula Nipple Type: Slow - flow   Lactation Tools Discussed/Used Pump Education: Milk Storage Pumped volume: 60 mL  Interventions Interventions: Education  Discharge Discharge Education: Engorgement and breast care Pump: Personal;DEBP (Motif)  Consult Status Consult Status: Complete Date: 11/26/21    Dahlia Byes [place] 11/26/2021, 9:16 PM

## 2021-11-27 ENCOUNTER — Ambulatory Visit: Payer: Self-pay

## 2021-11-27 NOTE — Lactation Note (Signed)
This note was copied from a baby's chart. Lactation Consultation Note  Patient Name: Brianna Wallace JMEQA'S Date: 11/27/2021 Reason for consult: Follow-up assessment Age:25 days  Baby off phototherapy.  Discussed how infant's can still be sleepy and to wake as needed.Feed on demand with cues.  Goal 8-12+ times per day after first 24 hrs.  Place baby STS if not cueing.  Reviewed engorgement care and monitoring voids/stools. Mother is now pumping approx 60 ml +.  Suggest mother attempt back at breast when she gets home.  Mother knows to call for help as needed.   Feeding Mother's Current Feeding Choice: Breast Milk and Formula   Lactation Tools Discussed/Used Pumped volume: 60 mL  Interventions Interventions: Education;DEBP  Discharge Discharge Education: Engorgement and breast care;Warning signs for feeding baby  Consult Status Consult Status: Complete Date: 11/27/21    Dahlia Byes [facility] 11/27/2021, 3:08 PM

## 2021-12-04 ENCOUNTER — Telehealth (HOSPITAL_COMMUNITY): Payer: Self-pay | Admitting: *Deleted

## 2021-12-04 NOTE — Telephone Encounter (Signed)
Hospital Discharge Follow-Up Call:  Patient reports that she is well and has no concerns about her healing process.  EPDS today was 2 and she endorses this accurately reflects that she is doing well emotionally.  Patient says that baby is well and she has no concerns about baby's health.  She reports that baby sleeps in a bassinet.  Reviewed ABCs of Safe Sleep.
